# Patient Record
Sex: Female | Born: 1940 | Race: White | Hispanic: No | Marital: Married | Smoking: Former smoker
Health system: Southern US, Community
[De-identification: ages and names within clinical notes are randomized; demographics above are authoritative.]

## PROBLEM LIST (undated history)

## (undated) DIAGNOSIS — M1990 Unspecified osteoarthritis, unspecified site: Secondary | ICD-10-CM

## (undated) DIAGNOSIS — E785 Hyperlipidemia, unspecified: Secondary | ICD-10-CM

## (undated) DIAGNOSIS — I739 Peripheral vascular disease, unspecified: Secondary | ICD-10-CM

## (undated) HISTORY — PX: FOOT SURGERY: SHX648

## (undated) HISTORY — DX: Peripheral vascular disease, unspecified: I73.9

## (undated) HISTORY — PX: PAROTIDECTOMY: SUR1003

## (undated) HISTORY — DX: Unspecified osteoarthritis, unspecified site: M19.90

## (undated) HISTORY — DX: Hyperlipidemia, unspecified: E78.5

## (undated) HISTORY — PX: EYE SURGERY: SHX253

## (undated) HISTORY — PX: ABDOMINAL HYSTERECTOMY: SHX81

---

## 2002-03-02 DIAGNOSIS — C801 Malignant (primary) neoplasm, unspecified: Secondary | ICD-10-CM

## 2002-03-02 HISTORY — DX: Malignant (primary) neoplasm, unspecified: C80.1

## 2008-12-22 ENCOUNTER — Emergency Department (HOSPITAL_COMMUNITY): Admission: EM | Admit: 2008-12-22 | Discharge: 2008-12-22 | Payer: Self-pay | Admitting: Emergency Medicine

## 2010-04-29 IMAGING — CR DG KNEE COMPLETE 4+V*R*
4 series · 4 of 4 positions shown · non-contrast
Comparison: None available.

CLINICAL DATA: Leg pain.  Possible infection.

RIGHT KNEE - COMPLETE 4+ VIEW

[t knee ap right]
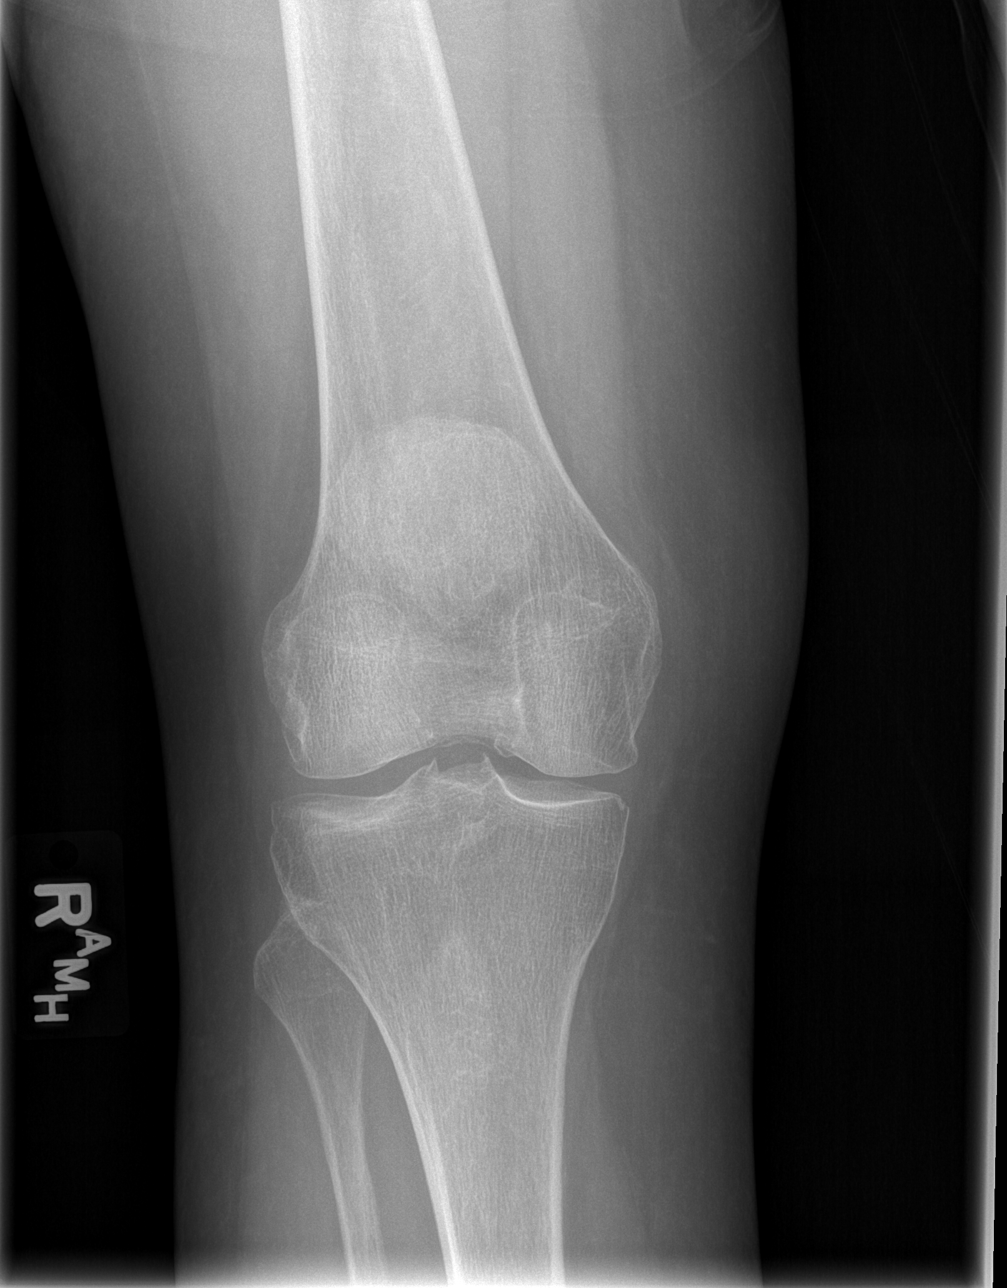

[t knee oblique right (1 of 2)]
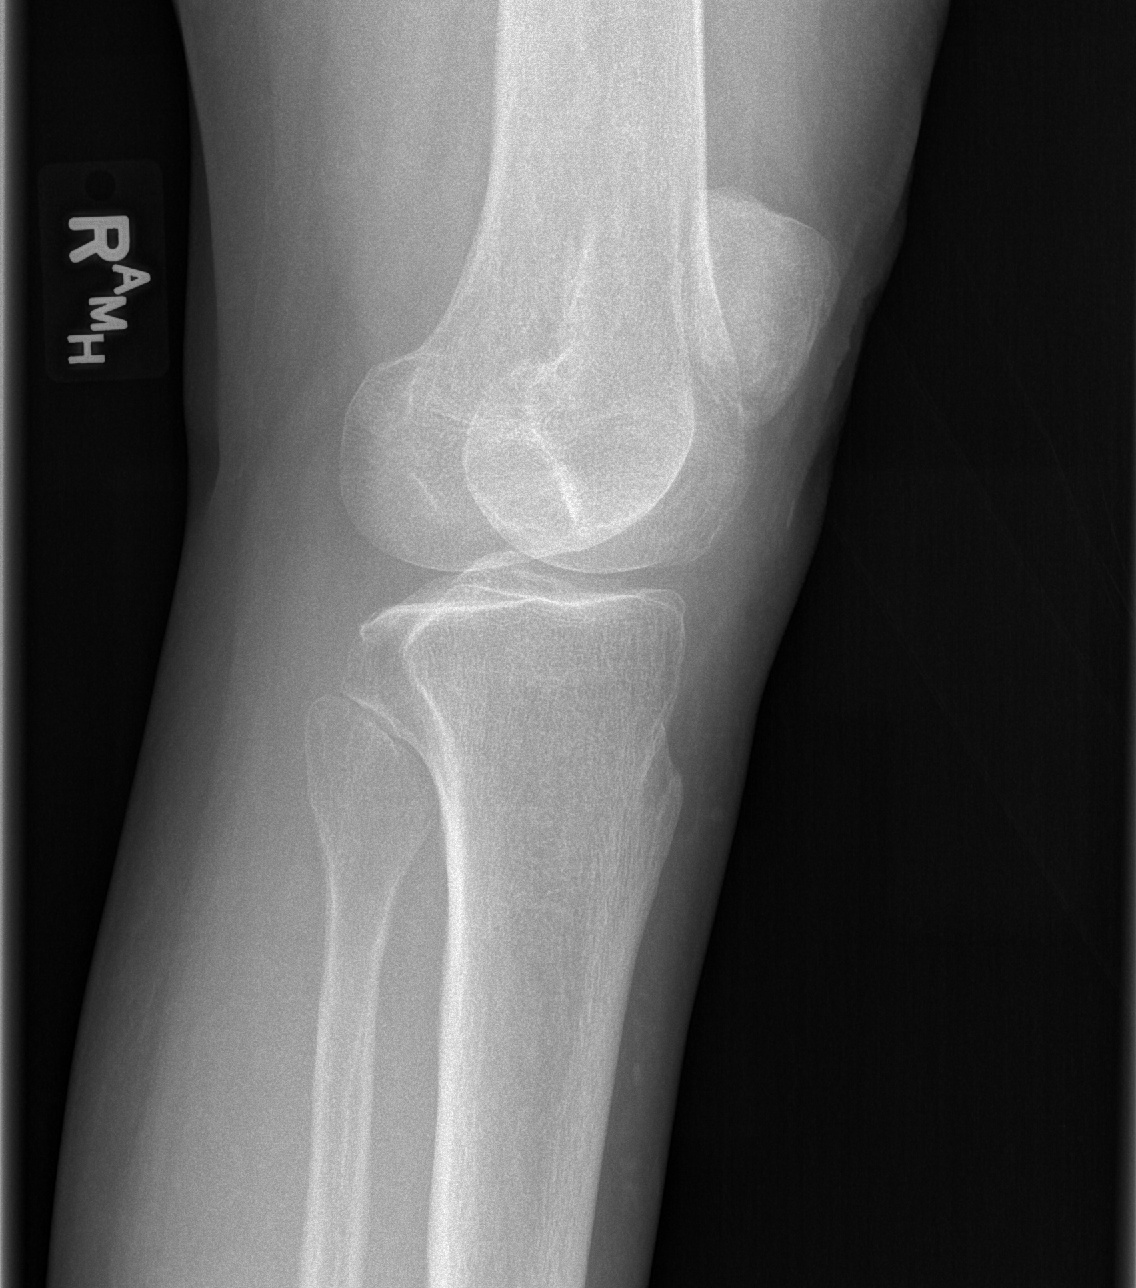

[t knee oblique right (2 of 2)]
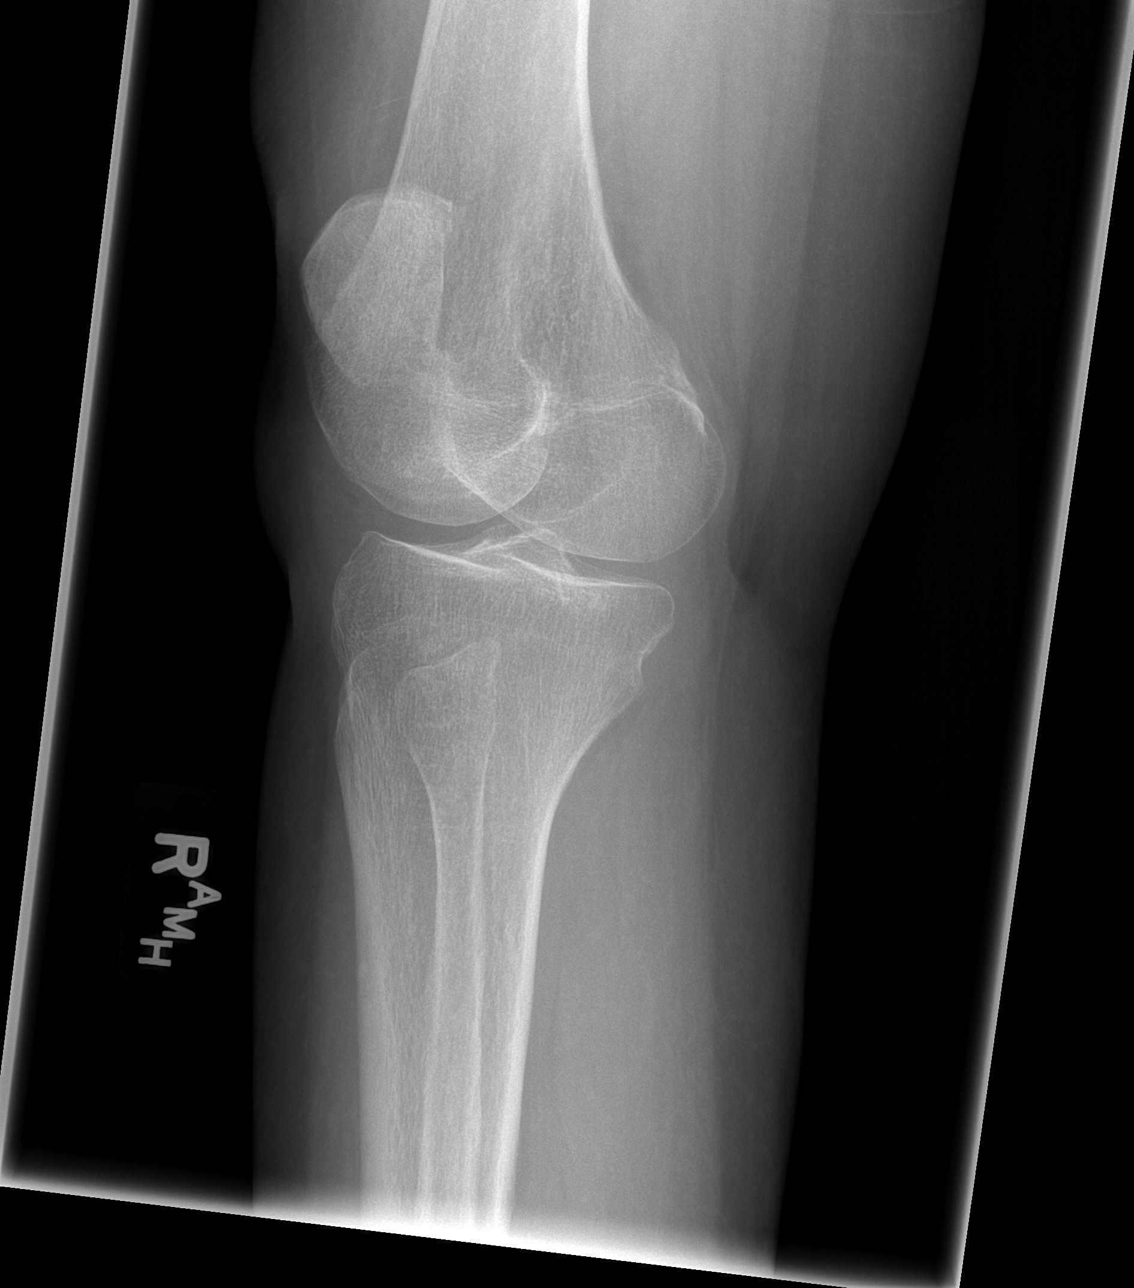

[t knee lat right]
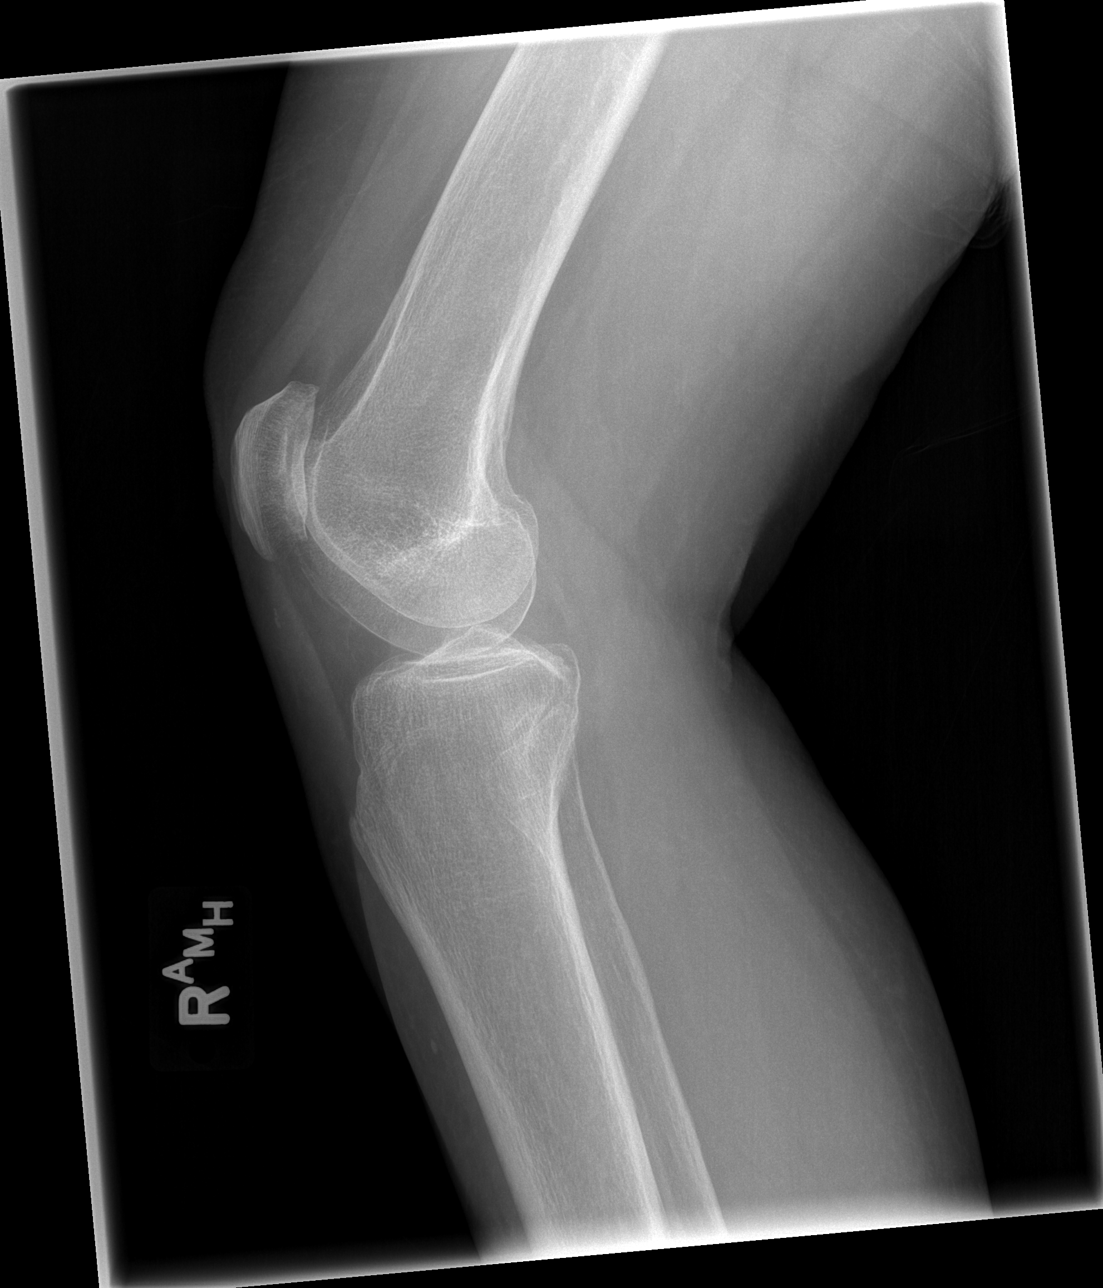

[4 of 4 positions shown; findings below may reference images not displayed]

FINDINGS: Only a tiny amount of joint fluid is identified.  No bony
destructive change is seen.  There is no fracture or dislocation.
There is some degenerative disease about the knee.
IMPRESSION: 1.  No plain film evidence of infectious process.
2.  Degenerative disease.

## 2015-03-03 DIAGNOSIS — F32A Depression, unspecified: Secondary | ICD-10-CM

## 2015-03-03 DIAGNOSIS — F410 Panic disorder [episodic paroxysmal anxiety] without agoraphobia: Secondary | ICD-10-CM

## 2015-03-03 HISTORY — DX: Panic disorder (episodic paroxysmal anxiety): F41.0

## 2015-03-03 HISTORY — DX: Depression, unspecified: F32.A

## 2017-03-02 HISTORY — PX: JOINT REPLACEMENT: SHX530

## 2020-06-20 ENCOUNTER — Encounter: Payer: Self-pay | Admitting: *Deleted

## 2020-06-29 ENCOUNTER — Other Ambulatory Visit: Payer: Self-pay

## 2020-06-29 DIAGNOSIS — I83893 Varicose veins of bilateral lower extremities with other complications: Secondary | ICD-10-CM

## 2020-07-09 ENCOUNTER — Other Ambulatory Visit: Payer: Self-pay

## 2020-07-09 ENCOUNTER — Ambulatory Visit (INDEPENDENT_AMBULATORY_CARE_PROVIDER_SITE_OTHER): Payer: Medicare Other | Admitting: Vascular Surgery

## 2020-07-09 ENCOUNTER — Encounter: Payer: Self-pay | Admitting: Vascular Surgery

## 2020-07-09 ENCOUNTER — Encounter (HOSPITAL_COMMUNITY): Payer: Medicare Other

## 2020-07-09 ENCOUNTER — Ambulatory Visit (HOSPITAL_COMMUNITY)
Admission: RE | Admit: 2020-07-09 | Discharge: 2020-07-09 | Disposition: A | Discharge status: Home / Self Care | Payer: Medicare Other | Source: Ambulatory Visit | Attending: Vascular Surgery | Admitting: Vascular Surgery

## 2020-07-09 VITALS — BP 135/81 | HR 72 | Temp 97.7°F | Resp 20 | Ht 68.0 in | Wt 174.0 lb

## 2020-07-09 DIAGNOSIS — I8390 Asymptomatic varicose veins of unspecified lower extremity: Secondary | ICD-10-CM

## 2020-07-09 DIAGNOSIS — I83893 Varicose veins of bilateral lower extremities with other complications: Secondary | ICD-10-CM | POA: Diagnosis present

## 2020-07-09 NOTE — Progress Notes (Signed)
[facility] [place]  ASSESSMENT / PLAN: 80 y.o. 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  She has a painful scattered <1cm eczematous rash across her lower extremities which not appear typical of venous ulceration to me.  She has paresthesias of her bilateral feet associated with sciatic type discomfort and lower back pain. There is no evidence of hemodynamically significant peripheral arterial disease on clinical exam.  I do not think her venous pathology can explain her symptoms.  I encouraged her to follow-up with her primary care physician for evaluation of her lower extremity symptoms.  She can follow-up with me as needed.  CHIEF COMPLAINT: leg discomfort  HISTORY OF PRESENT ILLNESS: Raven Sanders is a 80 y.o. female who presents to clinic for evaluation of bilateral lower extremity discomfort.  She was seen in the past by a vein interventional list who was working her up for what sounds like a saphenous vein ablation.  In the interim she has developed lower extremity paresthesias about her feet and ankles bilaterally, as well as sciatic type discomfort radiating down her right leg.  She has chronic lower back pain.  She was concerned that her venous varicosities may be the cause of the symptoms and desired evaluation.  On my evaluation, the patient reports no symptoms typical of intermittent claudication.  She has no symptoms typical of ischemic rest pain.  She has no ulcers about her feet.  Past Medical History:  Diagnosis Date  . Arthritis   . 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 Depression 2017  . Hyperlipidemia   . Panic disorder 2017  . Peripheral vascular disease [facility])     Past Surgical History:  Procedure Laterality Date  . ABDOMINAL HYSTERECTOMY     partial  . EYE SURGERY    . FOOT SURGERY    . JOINT REPLACEMENT Left 2019   knee  . PAROTIDECTOMY      Family History  Problem Relation Age of Onset  . Stroke Father    . Hypertension Father     Social History   Socioeconomic History  . Marital status: Married    Spouse name: Not on file  . Number of children: Not on file  . Years of education: Not on file  . Highest education level: Not on file  Occupational History  . Not on file  Tobacco Use  . Smoking status: Former Smoker    Quit date: 2002    Years since quitting: 20.3  . Smokeless tobacco: Never Used  Vaping Use  . Vaping Use: Never used  Substance and Sexual Activity  . Alcohol use: Not Currently  . Drug use: Never  . Sexual activity: Not on file  Other Topics Concern  . Not on file  Social History Narrative  . Not on file   Social Determinants of Health   Financial Resource Strain: Not on file  Food Insecurity: Not on file  Transportation Needs: Not on file  Physical Activity: Not on file  Stress: Not on file  Social Connections: Not on file  Intimate Partner Violence: Not on file    Allergies  Allergen Reactions  . Codeine Nausea Only  . Prednisone Swelling  . Latex Rash    Other Reaction: Other reaction-blisters  . Neomycin-Polymyxin B Gu Rash    Current Outpatient Medications  Medication Sig Dispense Refill  . ALPRAZolam (XANAX) 0.5 MG tablet Take 0.25 mg by mouth at bedtime.    . carboxymethylcellulose (  REFRESH PLUS) 0.5 % SOLN 1 drop daily as needed.    . cholecalciferol (VITAMIN D3) 25 MCG (1000 UNIT) tablet Take 2,000 Units by mouth daily.    . Multiple Vitamin (MULTIVITAMIN) capsule Take 1 capsule by mouth daily.    . naproxen sodium (ALEVE) 220 MG tablet Take 220 mg by mouth 2 (two) times daily as needed.    Marland Kitchen PARoxetine (PAXIL) 20 MG tablet Take 1 tablet by mouth daily.    . rosuvastatin (CRESTOR) 5 MG tablet Take 5 mg by mouth daily.    . vitamin B-12 (CYANOCOBALAMIN) 100 MCG tablet Take 100 mcg by mouth daily.    . vitamin C (ASCORBIC ACID) 500 MG tablet Take 500 mg by mouth daily.     No current facility-administered medications for this visit.     REVIEW OF SYSTEMS:  [X]  denotes positive finding, [ ]  denotes negative finding Cardiac  Comments:  Chest pain or chest pressure:    Shortness of breath upon exertion:    Short of breath when lying flat:    Irregular heart rhythm:        Vascular    Pain in calf, thigh, or hip brought on by ambulation: x   Pain in feet at night that wakes you up from your sleep:  x   Blood clot in your veins:    Leg swelling:  x       Pulmonary    Oxygen at home:    Productive cough:     Wheezing:         Neurologic    Sudden weakness in arms or legs:     Sudden numbness in arms or legs:     Sudden onset of difficulty speaking or slurred speech:    Temporary loss of vision in one eye:     Problems with dizziness:         Gastrointestinal    Blood in stool:     Vomited blood:         Genitourinary    Burning when urinating:     Blood in urine:        Psychiatric    Major depression:         Hematologic    Bleeding problems:    Problems with blood clotting too easily:        Skin    Rashes or ulcers:        Constitutional    Fever or chills:      PHYSICAL EXAM  Vitals:   07/09/20 1449  BP: 135/81  Pulse: 72  Resp: 20  Temp: 97.7 F (36.5 C)  SpO2: 99%  Weight: 174 lb (78.9 kg)  Height: 5\' 8"  (1.727 m)    Constitutional: well appearing. no distress. Appears well nourished.  Neurologic: CN intact. no focal findings. no sensory loss. Psychiatric: Mood and affect symmetric and appropriate. Eyes: No icterus. No conjunctival pallor. Ears, nose, throat: mucous membranes moist. Midline trachea.  Cardiac: regular rate and rhythm.  Respiratory: unlabored. Abdominal: soft, non-tender, non-distended.  Peripheral vascular:  2+ DP pulses  Scattered reticular veins  No varicosities Extremity: No edema. No cyanosis. No pallor.  Skin: No gangrene. No ulceration.  Lymphatic: No Stemmer's sign. No palpable lymphadenopathy.  PERTINENT LABORATORY AND RADIOLOGIC DATA Venous  reflux duplex 07/09/20   Raven Sanders. Raven Breed, MD [facility] Phone Number: [phone] 07/09/2020 4:52 PM
